# Patient Record
Sex: Female | Born: 1990 | Race: Black or African American | Hispanic: No
Health system: Southern US, Community
[De-identification: ages and names within clinical notes are randomized; demographics above are authoritative.]

---
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
## 2022-09-23 DIAGNOSIS — R451 Restlessness and agitation: Secondary | ICD-10-CM

## 2022-09-23 DIAGNOSIS — Z1152 Encounter for screening for COVID-19: Secondary | ICD-10-CM | POA: Insufficient documentation

## 2022-09-23 DIAGNOSIS — F209 Schizophrenia, unspecified: Secondary | ICD-10-CM | POA: Insufficient documentation

## 2022-09-23 DIAGNOSIS — R456 Violent behavior: Secondary | ICD-10-CM | POA: Insufficient documentation

## 2022-09-23 DIAGNOSIS — N3001 Acute cystitis with hematuria: Secondary | ICD-10-CM

## 2022-09-23 DIAGNOSIS — R4689 Other symptoms and signs involving appearance and behavior: Secondary | ICD-10-CM | POA: Insufficient documentation
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
## 2022-09-23 NOTE — ED Notes (Signed)
Handcuffs removed by [employer]. Pt is sleeping and vitals attained.
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
## 2022-09-23 NOTE — ED Notes (Signed)
IVC paperwork complete. Original in red folder in triage, 1 copy labeled and placed in medrec file, extra labels + 3 copies on clipboard in green zone NS Station. EXP: 09/30/22
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
## 2022-09-24 DIAGNOSIS — R4689 Other symptoms and signs involving appearance and behavior: Secondary | ICD-10-CM | POA: Insufficient documentation
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
## 2022-09-24 NOTE — Consult Note (Signed)
Patient observed resting in hall bed 11.  Sitter at bedside reports patient has been sleeping since agitation orders were required.  States patient woke up and started spitting.  This provider will attempt to reassess.
